# Patient Record
Sex: Female | Born: 1960 | Race: White | Hispanic: No | Marital: Married | Smoking: Never smoker
Health system: Southern US, Community
[De-identification: ages and names within clinical notes are randomized; demographics above are authoritative.]

## PROBLEM LIST (undated history)

## (undated) HISTORY — PX: SHOULDER SURGERY: SHX246

## (undated) HISTORY — PX: HIP SURGERY: SHX245

---

## 2013-02-08 ENCOUNTER — Emergency Department (INDEPENDENT_AMBULATORY_CARE_PROVIDER_SITE_OTHER): Payer: 59

## 2013-02-08 ENCOUNTER — Encounter (HOSPITAL_COMMUNITY): Payer: Self-pay | Admitting: Emergency Medicine

## 2013-02-08 ENCOUNTER — Emergency Department (HOSPITAL_COMMUNITY)
Admission: EM | Admit: 2013-02-08 | Discharge: 2013-02-08 | Disposition: A | Discharge status: Home / Self Care | Payer: 59 | Source: Home / Self Care | Admitting: Family Medicine

## 2013-02-08 DIAGNOSIS — S62639B Displaced fracture of distal phalanx of unspecified finger, initial encounter for open fracture: Secondary | ICD-10-CM

## 2013-02-08 MED ORDER — TETANUS-DIPHTH-ACELL PERTUSSIS 5-2.5-18.5 LF-MCG/0.5 IM SUSP
0.5000 mL | Freq: Once | INTRAMUSCULAR | Status: AC
  Administered 2013-02-08: 0.5 mL via INTRAMUSCULAR

## 2013-02-08 MED ORDER — TETANUS-DIPHTH-ACELL PERTUSSIS 5-2.5-18.5 LF-MCG/0.5 IM SUSP
INTRAMUSCULAR | Status: AC
  Filled 2013-02-08: qty 0.5

## 2013-02-08 NOTE — ED Provider Notes (Signed)
Medical screening examination/treatment/procedure(s) were performed by resident physician or non-physician practitioner and as supervising physician I was immediately available for consultation/collaboration.   Hulet Ehrmann DOUGLAS MD.   Ahmir Bracken D Casondra Gasca, MD 02/08/13 1742 

## 2013-02-08 NOTE — ED Provider Notes (Signed)
   History    CSN: [id number] Arrival date & time 02/08/13  1143  First MD Initiated Contact with Patient 02/08/13 1345     Chief Complaint  Patient presents with  . Finger Injury   (Consider location/radiation/quality/duration/timing/severity/associated sxs/prior Treatment) HPI  52 yo wf presents with the above complaint.  States that yesterday she jammed her right thumb in a car door.  Had had increased pain, swelling.  No numbness.  No other injuries.   History reviewed. No pertinent past medical history. Past Surgical History  Procedure Laterality Date  . Hip surgery    . Shoulder surgery Left    No family history on file. History  Substance Use Topics  . Smoking status: Never Smoker   . Smokeless tobacco: Not on file  . Alcohol Use: Yes   OB History   Grav Para Term Preterm Abortions TAB SAB Ect Mult Living                 Review of Systems  Constitutional: Negative.   HENT: Negative.   Respiratory: Negative.   Cardiovascular: Negative.   Gastrointestinal: Negative.   Genitourinary: Negative.   Musculoskeletal:       Thumb pain.    Skin: Negative.   Neurological: Negative.     Allergies  Review of patient's allergies indicates no known allergies.  Home Medications  No current outpatient prescriptions on file. BP 121/63  Pulse 60  Temp(Src) 98 F (36.7 C) (Oral)  Resp 18  SpO2 100%  LMP 02/07/2013 Physical Exam  Constitutional: She is oriented to person, place, and time. She appears well-developed and well-nourished.  HENT:  Head: Normocephalic and atraumatic.  Eyes: EOM are normal. Pupils are equal, round, and reactive to light.  Neck: Normal range of motion.  Pulmonary/Chest: Effort normal.  Musculoskeletal:  Right distal thumb swelling and tenderness.  Subungual hematoma.  Sensation intact.  Small superficial laceration radial distal thumb.  Bleeding controlled.   Neurological: She is alert and oriented to person, place, and time.  Skin: Skin is  warm and dry.  Psychiatric: She has a normal mood and affect.    ED Course  Procedures (including critical care time) Labs Reviewed - No data to display Dg Finger Thumb Right  02/08/2013   *RADIOLOGY REPORT*  Clinical Data: Recent traumatic injury with pain.  RIGHT THUMB 2+V  Comparison: None.  Findings: There is an undisplaced fracture through the mid portion of the first distal phalanx.  Mild soft tissue swelling is noted. Small bony densities are noted adjacent to the distal interphalangeal joint.  This may be related to prior trauma but not related to the current episode.  IMPRESSION: Undisplaced fracture of the first distal phalanx.   Original Report Authenticated By: Alcide Clever, M.D.   1. Phalanx, distal fracture of finger, open, initial encounter     MDM  Made appt for patient to see dr Cindee Salt today.  Dressing applied.  Given tetanus injection.   Meds ordered this encounter  Medications  . TDaP (BOOSTRIX) injection 0.5 mL    Sig:     Zonia Kief, PA-C 02/08/13 1350

## 2013-02-08 NOTE — ED Notes (Signed)
Pt c/o right thumb inj onset yest around 1500... Reports she jammed her finger w/car door... Laceration on right thumb; painful... Last tetanus = unknown... Alert w/no signs of acuate distress.

## 2013-02-21 ENCOUNTER — Other Ambulatory Visit (HOSPITAL_COMMUNITY): Payer: Self-pay | Admitting: *Deleted

## 2013-02-21 DIAGNOSIS — T8450XA Infection and inflammatory reaction due to unspecified internal joint prosthesis, initial encounter: Secondary | ICD-10-CM

## 2013-02-21 DIAGNOSIS — T84498A Other mechanical complication of other internal orthopedic devices, implants and grafts, initial encounter: Secondary | ICD-10-CM

## 2013-02-21 DIAGNOSIS — M1611 Unilateral primary osteoarthritis, right hip: Secondary | ICD-10-CM

## 2013-03-12 ENCOUNTER — Other Ambulatory Visit (HOSPITAL_COMMUNITY): Payer: 59

## 2013-03-12 ENCOUNTER — Encounter (HOSPITAL_COMMUNITY): Payer: 59

## 2013-03-13 ENCOUNTER — Encounter (HOSPITAL_COMMUNITY): Payer: 59

## 2013-03-20 ENCOUNTER — Encounter (HOSPITAL_COMMUNITY)
Admission: RE | Admit: 2013-03-20 | Discharge: 2013-03-20 | Disposition: A | Discharge status: Home / Self Care | Payer: 59 | Source: Ambulatory Visit | Attending: *Deleted | Admitting: *Deleted

## 2013-03-20 ENCOUNTER — Encounter (HOSPITAL_COMMUNITY)
Admission: RE | Admit: 2013-03-20 | Discharge: 2013-03-20 | Disposition: A | Discharge status: Home / Self Care | Payer: 59 | Source: Ambulatory Visit | Attending: Orthopedic Surgery | Admitting: Orthopedic Surgery

## 2013-03-20 DIAGNOSIS — T8450XA Infection and inflammatory reaction due to unspecified internal joint prosthesis, initial encounter: Secondary | ICD-10-CM

## 2013-03-20 DIAGNOSIS — M1611 Unilateral primary osteoarthritis, right hip: Secondary | ICD-10-CM

## 2013-03-20 DIAGNOSIS — M25559 Pain in unspecified hip: Secondary | ICD-10-CM | POA: Insufficient documentation

## 2013-03-20 DIAGNOSIS — Z96649 Presence of unspecified artificial hip joint: Secondary | ICD-10-CM | POA: Insufficient documentation

## 2013-03-20 DIAGNOSIS — T84498A Other mechanical complication of other internal orthopedic devices, implants and grafts, initial encounter: Secondary | ICD-10-CM

## 2013-03-20 IMAGING — NM NM BONE 3 PHASE
2 series · 12 of 12 positions shown · non-contrast
Comparison: None.

CLINICAL DATA: Right hip pain.  Right total hip arthroplasty with
revisions in  [PHONE_NUMBER]

NUCLEAR MEDICINE THREE PHASE BONE SCAN
TECHNIQUE: Radionuclide angiographic images, immediate static
blood pool images, and 3-hour delayed static images were obtained
after intravenous injection of radiopharmaceutical.
Radiopharmaceutical: [XY] JESU TECHNETIUM TC 99M
MEDRONATE IV KIT

[fl flow and static · 4.75mm/px · 6 of 40 frames shown (1 of 2)]
[frame 4/40  full-range]
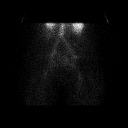
[frame 10/40  full-range]
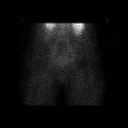
[frame 17/40  full-range]
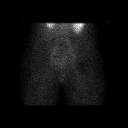
[frame 24/40  full-range]
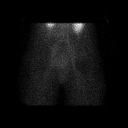
[frame 30/40  full-range]
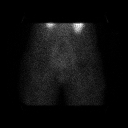
[frame 37/40  full-range]
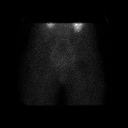

[fl flow and static · 4.75mm/px · 6 of 40 frames shown (2 of 2)]
[frame 4/40  full-range]
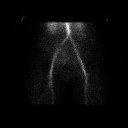
[frame 10/40  full-range]
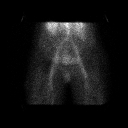
[frame 17/40  full-range]
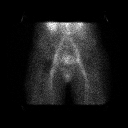
[frame 24/40  full-range]
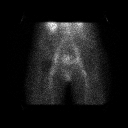
[frame 30/40  full-range]
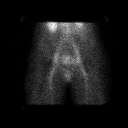
[frame 37/40  full-range]
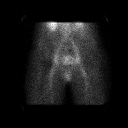

[12 of 12 positions shown; findings below may reference images not displayed]

FINDINGS: Vascular phase:  No asymmetric or increased vascular flow of the
left right hand.

Delayed phase:  No asymmetric or increased blood pool activity

Delayed phase:  There is photopenia the right hip.  No abnormal
uptake to suggest loosening or infection
IMPRESSION: No evidence of loosening or infection of the right hip prosthetic.

## 2013-03-20 MED ORDER — TECHNETIUM TC 99M MEDRONATE IV KIT
26.0000 | PACK | Freq: Once | INTRAVENOUS | Status: AC | PRN
  Administered 2013-03-20: 26 via INTRAVENOUS

## 2013-10-10 ENCOUNTER — Emergency Department (HOSPITAL_COMMUNITY)
Admission: EM | Admit: 2013-10-10 | Discharge: 2013-10-10 | Disposition: A | Discharge status: Home / Self Care | Payer: 59 | Source: Home / Self Care

## 2013-10-10 ENCOUNTER — Encounter (HOSPITAL_COMMUNITY): Payer: Self-pay | Admitting: Emergency Medicine

## 2013-10-10 DIAGNOSIS — S0502XA Injury of conjunctiva and corneal abrasion without foreign body, left eye, initial encounter: Secondary | ICD-10-CM

## 2013-10-10 DIAGNOSIS — H1033 Unspecified acute conjunctivitis, bilateral: Secondary | ICD-10-CM

## 2013-10-10 DIAGNOSIS — S058X9A Other injuries of unspecified eye and orbit, initial encounter: Secondary | ICD-10-CM

## 2013-10-10 MED ORDER — POLYMYXIN B-TRIMETHOPRIM 10000-0.1 UNIT/ML-% OP SOLN
1.0000 [drp] | OPHTHALMIC | Status: DC
Start: 2013-10-10 — End: 2015-08-21

## 2013-10-10 MED ORDER — TETRACAINE HCL 0.5 % OP SOLN
OPHTHALMIC | Status: AC
Start: 2013-10-10 — End: 2013-10-10
  Filled 2013-10-10: qty 2

## 2013-10-10 MED ORDER — TETRACAINE HCL 0.5 % OP SOLN
2.0000 [drp] | Freq: Once | OPHTHALMIC | Status: AC
  Administered 2013-10-10: 2 [drp] via OPHTHALMIC

## 2013-10-10 NOTE — ED Notes (Signed)
Left eye red and draining yesterday, this AM both eye red and left eye crusted. C/o FB sensation in left eye. No known FB type exposure known

## 2013-10-10 NOTE — ED Provider Notes (Signed)
CSN: [id number]     Arrival date & time 10/10/13  0904 History   First MD Initiated Contact with Patient 10/10/13 0930     Chief Complaint  Patient presents with  . Conjunctivitis   (Consider location/radiation/quality/duration/timing/severity/associated sxs/prior Treatment) HPI Comments: Early yesterday had redness and clear drainage from OS, f/b a FB sensation.  Today developing redness and irritation in OD. Denies visual changes, trauma.   History reviewed. No pertinent past medical history. Past Surgical History  Procedure Laterality Date  . Hip surgery    . Shoulder surgery Left    History reviewed. No pertinent family history. History  Substance Use Topics  . Smoking status: Never Smoker   . Smokeless tobacco: Not on file  . Alcohol Use: Yes   OB History   Grav Para Term Preterm Abortions TAB SAB Ect Mult Living                 Review of Systems  Eyes: Positive for discharge and redness. Negative for visual disturbance.  Skin: Negative for rash.  Psychiatric/Behavioral: Negative.   All other systems reviewed and are negative.    Allergies  Review of patient's allergies indicates no known allergies.  Home Medications   Current Outpatient Rx  Name  Route  Sig  Dispense  Refill  . trimethoprim-polymyxin b (POLYTRIM) ophthalmic solution   Both Eyes   Place 1 drop into both eyes every 4 (four) hours.   10 mL   0    BP 110/71  Pulse 59  Temp(Src) 98.2 F (36.8 C) (Oral)  Resp 10  SpO2 100% Physical Exam  Nursing note and vitals reviewed. Constitutional: She is oriented to person, place, and time. She appears well-developed and well-nourished. No distress.  Eyes: EOM are normal. Pupils are equal, round, and reactive to light.  Bilat conjunctiva mildly injected L>R. Clear to amber drainage from OS. No FB seen with magnification.   Pulmonary/Chest: Effort normal. She has no wheezes.  Neurological: She is alert and oriented to person, place, and time.   Skin: Skin is warm and dry. No rash noted.  Psychiatric: She has a normal mood and affect.    ED Course  Irrigation Date/Time: 10/10/2013 10:02 AM Performed by: Phineas RealMABE, Deb Loudin Authorized by: Clementeen GrahamOREY, EVAN, S Consent: Verbal consent obtained. Risks and benefits: risks, benefits and alternatives were discussed Consent given by: patient Patient understanding: patient states understanding of the procedure being performed Patient identity confirmed: verbally with patient Local anesthesia used: yes Local anesthetic: topical anesthetic Anesthetic total: 0.02 ml Patient sedated: no Patient tolerance: Patient tolerated the procedure well with no immediate complications. Comments: Tetracaine op gtts x 2 OS. Flurosceine stain applied. Under blue light uptake is seen at 3 and 4 o'clock position over the outer cornea. There are 3 separate irregularly shaped lesions adjacent to one another. The OS was then irrigated.   (including critical care time) Labs Review Labs Reviewed - No data to display Imaging Review No results found.   MDM   1. Left corneal abrasion   2. Conjunctivitis, acute, bilateral    OS irrigated after staining Polytrim op qid ou x 5  D Warm compresses. If worse or no improvement may return or see opthal    Hayden Rasmussenavid Chen Holzman, NP 10/10/13 1005

## 2013-10-10 NOTE — Discharge Instructions (Signed)
Bacterial Conjunctivitis °Bacterial conjunctivitis, commonly called pink eye, is an inflammation of the clear membrane that covers the white part of the eye (conjunctiva). The inflammation can also happen on the underside of the eyelids. The blood vessels in the conjunctiva become inflamed causing the eye to become red or pink. Bacterial conjunctivitis may spread easily from one eye to another and from person to person (contagious).  °CAUSES  °Bacterial conjunctivitis is caused by bacteria. The bacteria may come from your own skin, your upper respiratory tract, or from someone else with bacterial conjunctivitis. °SYMPTOMS  °The normally white color of the eye or the underside of the eyelid is usually pink or red. The pink eye is usually associated with irritation, tearing, and some sensitivity to light. Bacterial conjunctivitis is often associated with a thick, yellowish discharge from the eye. The discharge may turn into a crust on the eyelids overnight, which causes your eyelids to stick together. If a discharge is present, there may also be some blurred vision in the affected eye. °DIAGNOSIS  °Bacterial conjunctivitis is diagnosed by your caregiver through an eye exam and the symptoms that you report. Your caregiver looks for changes in the surface tissues of your eyes, which may point to the specific type of conjunctivitis. A sample of any discharge may be collected on a cotton-tip swab if you have a severe case of conjunctivitis, if your cornea is affected, or if you keep getting repeat infections that do not respond to treatment. The sample will be sent to a lab to see if the inflammation is caused by a bacterial infection and to see if the infection will respond to antibiotic medicines. °TREATMENT  °· Bacterial conjunctivitis is treated with antibiotics. Antibiotic eyedrops are most often used. However, antibiotic ointments are also available. Antibiotics pills are sometimes used. Artificial tears or eye  washes may ease discomfort. °HOME CARE INSTRUCTIONS  °· To ease discomfort, apply a cool, clean wash cloth to your eye for 10 20 minutes, 3 4 times a day. °· Gently wipe away any drainage from your eye with a warm, wet washcloth or a cotton ball. °· Wash your hands often with soap and water. Use paper towels to dry your hands. °· Do not share towels or wash cloths. This may spread the infection. °· Change or wash your pillow case every day. °· You should not use eye makeup until the infection is gone. °· Do not operate machinery or drive if your vision is blurred. °· Stop using contacts lenses. Ask your caregiver how to sterilize or replace your contacts before using them again. This depends on the type of contact lenses that you use. °· When applying medicine to the infected eye, do not touch the edge of your eyelid with the eyedrop bottle or ointment tube. °SEEK IMMEDIATE MEDICAL CARE IF:  °· Your infection has not improved within 3 days after beginning treatment. °· You had yellow discharge from your eye and it returns. °· You have increased eye pain. °· Your eye redness is spreading. °· Your vision becomes blurred. °· You have a fever or persistent symptoms for more than 2 3 days. °· You have a fever and your symptoms suddenly get worse. °· You have facial pain, redness, or swelling. °MAKE SURE YOU:  °· Understand these instructions. °· Will watch your condition. °· Will get help right away if you are not doing well or get worse. °Document Released: 07/05/2005 Document Revised: 03/29/2012 Document Reviewed: 12/06/2011 °[employer]® Patient Information ©2014 [employer]. ° ° °  Conjunctivitis Conjunctivitis is commonly called "pink eye." Conjunctivitis can be caused by bacterial or viral infection, allergies, or injuries. There is usually redness of the lining of the eye, itching, discomfort, and sometimes discharge. There may be deposits of matter along the eyelids. A viral infection usually causes a watery  discharge, while a bacterial infection causes a yellowish, thick discharge. Pink eye is very contagious and spreads by direct contact. You may be given antibiotic eyedrops as part of your treatment. Before using your eye medicine, remove all drainage from the eye by washing gently with warm water and cotton balls. Continue to use the medication until you have awakened 2 mornings in a row without discharge from the eye. Do not rub your eye. This increases the irritation and helps spread infection. Use separate towels from other household members. Wash your hands with soap and water before and after touching your eyes. Use cold compresses to reduce pain and sunglasses to relieve irritation from light. Do not wear contact lenses or wear eye makeup until the infection is gone. SEEK MEDICAL CARE IF:   Your symptoms are not better after 3 days of treatment.  You have increased pain or trouble seeing.  The outer eyelids become very red or swollen. Document Released: 08/12/2004 Document Revised: 09/27/2011 Document Reviewed: 07/05/2005 [facility] Patient Information 2014 [place], [place].  Corneal Abrasion The cornea is the clear covering at the front and center of the eye. When looking at the colored portion of the eye (iris), you are looking through the cornea. This very thin tissue is made up of many layers. The surface layer is a single layer of cells (corneal epithelium) and is one of the most sensitive tissues in the body. If a scratch or injury causes the corneal epithelium to come off, it is called a corneal abrasion. If the injury extends to the tissues below the epithelium, the condition is called a corneal ulcer. CAUSES   Scratches.  Trauma.  Foreign body in the eye. Some people have recurrences of abrasions in the area of the original injury even after it has healed (recurrent erosion syndrome). Recurrent erosion syndrome generally improves and goes away with time. SYMPTOMS   Eye  pain.  Difficulty or inability to keep the injured eye open.  The eye becomes very sensitive to light.  Recurrent erosions tend to happen suddenly, first thing in the morning, usually after waking up and opening the eye. DIAGNOSIS  Your health care provider can diagnose a corneal abrasion during an eye exam. Dye is usually placed in the eye using a drop or a small paper strip moistened by your tears. When the eye is examined with a special light, the abrasion shows up clearly because of the dye. TREATMENT   Small abrasions may be treated with antibiotic drops or ointment alone.  Usually a pressure patch is specially applied. Pressure patches prevent the eye from blinking, allowing the corneal epithelium to heal. A pressure patch also reduces the amount of pain present in the eye during healing. Most corneal abrasions heal within 2 3 days with no effect on vision. If the abrasion becomes infected and spreads to the deeper tissues of the cornea, a corneal ulcer can result. This is serious because it can cause corneal scarring. Corneal scars interfere with light passing through the cornea and cause a loss of vision in the involved eye. HOME CARE INSTRUCTIONS  Use medicine or ointment as directed. Only take over-the-counter or prescription medicines for pain, discomfort, or fever as  directed by your health care provider.  Do not drive or operate machinery while your eye is patched. Your ability to judge distances is impaired.  If your health care provider has given you a follow-up appointment, it is very important to keep that appointment. Not keeping the appointment could result in a severe eye infection or permanent loss of vision. If there is any problem keeping the appointment, let your health care provider know. SEEK MEDICAL CARE IF:   You have pain, light sensitivity, and a scratchy feeling in one eye or both eyes.  Your pressure patch keeps loosening up, and you can blink your eye under  the patch after treatment.  Any kind of discharge develops from the eye after treatment or if the lids stick together in the morning.  You have the same symptoms in the morning as you did with the original abrasion days, weeks, or months after the abrasion healed. MAKE SURE YOU:   Understand these instructions.  Will watch your condition.  Will get help right away if you are not doing well or get worse. Document Released: 07/02/2000 Document Revised: 04/25/2013 Document Reviewed: 03/12/2013 [facility] Patient Information 2014 [place], [place].

## 2013-10-11 NOTE — ED Provider Notes (Signed)
Medical screening examination/treatment/procedure(s) were performed by a resident physician or non-physician practitioner and as the supervising physician I was immediately available for consultation/collaboration.  Evan Corey, MD    Evan S Corey, MD 10/11/13 1816 

## 2015-08-21 ENCOUNTER — Emergency Department (INDEPENDENT_AMBULATORY_CARE_PROVIDER_SITE_OTHER)
Admission: EM | Admit: 2015-08-21 | Discharge: 2015-08-21 | Disposition: A | Discharge status: Home / Self Care | Payer: 59 | Source: Home / Self Care | Attending: Family Medicine | Admitting: Family Medicine

## 2015-08-21 ENCOUNTER — Encounter: Payer: Self-pay | Admitting: *Deleted

## 2015-08-21 DIAGNOSIS — H109 Unspecified conjunctivitis: Secondary | ICD-10-CM

## 2015-08-21 MED ORDER — POLYMYXIN B-TRIMETHOPRIM 10000-0.1 UNIT/ML-% OP SOLN: 1.0000 [drp] | OPHTHALMIC | Status: DC

## 2015-08-21 NOTE — ED Provider Notes (Signed)
CSN: [id number]     Arrival date & time 08/21/15  4098 History   First MD Initiated Contact with Patient 08/21/15 0935     Chief Complaint  Patient presents with  . Eye Drainage   (Consider location/radiation/quality/duration/timing/severity/associated sxs/prior Treatment) HPI Pt is a 56yo female presenting to [facility] with c/o Right eye redness, itching, and mild burning with clear-yellow drainage since yesterday. Denies fever, chills, n/v/d. Denies cough, congestion, sore throat, or ear pain.  She had hx of bacterial conjunctivitis about 2 years ago with a corneal abrasion and had leftover Polytrim, which she started using yesterday. She has had mild to moderate relief of symptoms since using the eye drops. Denies sick contacts or recent travel. Denies trauma to her eye. Does not believe there is something in her eye but feels a foreign body sensation at times. Denies wearing contacts but does wear reading glasses.  History reviewed. No pertinent past medical history. Past Surgical History  Procedure Laterality Date  . Hip surgery    . Shoulder surgery Left    Family History  Problem Relation Age of Onset  . Diabetes Father   . Diabetes Brother    Social History  Substance Use Topics  . Smoking status: Never Smoker   . Smokeless tobacco: Never Used  . Alcohol Use: Yes   OB History    No data available     Review of Systems  Constitutional: Negative for fever and chills.  HENT: Negative for congestion, ear pain, postnasal drip, rhinorrhea, sinus pressure, sore throat and voice change.   Eyes: Positive for pain, discharge, redness and itching. Negative for photophobia and visual disturbance.  Respiratory: Negative for cough and shortness of breath.   Gastrointestinal: Negative for nausea, vomiting, abdominal pain and diarrhea.  Neurological: Negative for dizziness, light-headedness and headaches.    Allergies  Review of patient's allergies indicates no known allergies.  Home  Medications   Prior to Admission medications   Medication Sig Start Date End Date Taking? Authorizing Provider  trimethoprim-polymyxin b (POLYTRIM) ophthalmic solution Place 1 drop into the right eye every 4 (four) hours. For 5 days 08/21/15   Junius Finner, PA-C   Meds Ordered and Administered this Visit  Medications - No data to display  BP 118/64 mmHg  Pulse 60  Temp(Src) 97.5 F (36.4 C) (Oral)  Resp 14  Ht  (1.676 m)  Wt 131 lb (59.421 kg)  BMI 21.15 kg/m2  SpO2 98% No data found.   Physical Exam  Constitutional: She is oriented to person, place, and time. She appears well-developed and well-nourished.  HENT:  Head: Normocephalic and atraumatic.  Eyes: EOM are normal. Pupils are equal, round, and reactive to light. Lids are everted and swept, no foreign bodies found. Right eye exhibits discharge ( scant yellow discharge). Right eye exhibits no chemosis and no exudate. Left eye exhibits no chemosis, no discharge and no exudate. Right conjunctiva is injected. Right conjunctiva has no hemorrhage. Left conjunctiva is not injected. Left conjunctiva has no hemorrhage.  Right eye: mild injection. No periorbital edema, erythema or tenderness. Scant yellow discharge.  No fluorescein uptake. No foreign bodies seen.  Neck: Normal range of motion.  Cardiovascular: Normal rate.   Pulmonary/Chest: Effort normal.  Musculoskeletal: Normal range of motion.  Neurological: She is alert and oriented to person, place, and time.  Skin: Skin is warm and dry.  Psychiatric: She has a normal mood and affect. Her behavior is normal.  Nursing note and vitals reviewed.  ED Course  Procedures (including critical care time)  Labs Review Labs Reviewed - No data to display  Imaging Review No results found.   Visual Acuity Review  Right Eye Distance: 20/25 Left Eye Distance: 20/25 Bilateral Distance: 20/25   MDM   1. Conjunctivitis, right eye    Pt c/o Right eye irritation, redness,  and discharge since yesterday. No evidence of periorbital cellulitis.   No evidence of foreign bodies or corneal abrasion.  Pt has already started using Polytrim and has had improvement since yesterday per pt.   Will refill fresh prescription as medication she is using is 57 years old. Advised to use for a total of 5 days.  Rx: Polytrim Encouraged to f/u with PCP or ophthalmologist/optomotrist in 4-5 days if not improving, sooner if worsening. Patient verbalized understanding and agreement with treatment plan.     Junius Finner, PA-C 08/21/15 1215

## 2015-08-21 NOTE — Discharge Instructions (Signed)
Bacterial Conjunctivitis °Bacterial conjunctivitis, commonly called pink eye, is an inflammation of the clear membrane that covers the white part of the eye (conjunctiva). The inflammation can also happen on the underside of the eyelids. The blood vessels in the conjunctiva become inflamed, causing the eye to become red or pink. Bacterial conjunctivitis may spread easily from one eye to another and from person to person (contagious).  °CAUSES  °Bacterial conjunctivitis is caused by bacteria. The bacteria may come from your own skin, your upper respiratory tract, or from someone else with bacterial conjunctivitis. °SYMPTOMS  °The normally white color of the eye or the underside of the eyelid is usually pink or red. The pink eye is usually associated with irritation, tearing, and some sensitivity to light. Bacterial conjunctivitis is often associated with a thick, yellowish discharge from the eye. The discharge may turn into a crust on the eyelids overnight, which causes your eyelids to stick together. If a discharge is present, there may also be some blurred vision in the affected eye. °DIAGNOSIS  °Bacterial conjunctivitis is diagnosed by your caregiver through an eye exam and the symptoms that you report. Your caregiver looks for changes in the surface tissues of your eyes, which may point to the specific type of conjunctivitis. A sample of any discharge may be collected on a cotton-tip swab if you have a severe case of conjunctivitis, if your cornea is affected, or if you keep getting repeat infections that do not respond to treatment. The sample will be sent to a lab to see if the inflammation is caused by a bacterial infection and to see if the infection will respond to antibiotic medicines. °TREATMENT  °1. Bacterial conjunctivitis is treated with antibiotics. Antibiotic eyedrops are most often used. However, antibiotic ointments are also available. Antibiotics pills are sometimes used. Artificial tears or eye  washes may ease discomfort. °HOME CARE INSTRUCTIONS  °1. To ease discomfort, apply a cool, clean washcloth to your eye for 10-20 minutes, 3-4 times a day. °2. Gently wipe away any drainage from your eye with a warm, wet washcloth or a cotton ball. °3. Wash your hands often with soap and water. Use paper towels to dry your hands. °4. Do not share towels or washcloths. This may spread the infection. °5. Change or wash your pillowcase every day. °6. You should not use eye makeup until the infection is gone. °7. Do not operate machinery or drive if your vision is blurred. °8. Stop using contact lenses. Ask your caregiver how to sterilize or replace your contacts before using them again. This depends on the type of contact lenses that you use. °9. When applying medicine to the infected eye, do not touch the edge of your eyelid with the eyedrop bottle or ointment tube. °SEEK IMMEDIATE MEDICAL CARE IF:  °· Your infection has not improved within 3 days after beginning treatment. °· You had yellow discharge from your eye and it returns. °· You have increased eye pain. °· Your eye redness is spreading. °· Your vision becomes blurred. °· You have a fever or persistent symptoms for more than 2-3 days. °· You have a fever and your symptoms suddenly get worse. °· You have facial pain, redness, or swelling. °MAKE SURE YOU:  °· Understand these instructions. °· Will watch your condition. °· Will get help right away if you are not doing well or get worse. °  °This information is not intended to replace advice given to you by your health care provider. Make sure you   discuss any questions you have with your health care provider. °  °Document Released: 07/05/2005 Document Revised: 07/26/2014 Document Reviewed: 12/06/2011 °Elsevier Interactive Patient Education ©2016 [employer] ° °How to Use Eye Drops and Eye Ointments °HOW TO APPLY EYE DROPS °Follow these steps when applying eye drops: °2. Wash your hands. °3. Tilt your head  back. °4. Put a finger under your eye and use it to gently pull your lower lid downward. Keep that finger in place. °5. Using your other hand, hold the dropper between your thumb and index finger. °6. Position the dropper just over the edge of the lower lid. Hold it as close to your eye as you can without touching the dropper to your eye. °7. Steady your hand. One way to do this is to lean your index finger against your brow. °8. Look up. °9. Slowly and gently squeeze one drop of medicine into your eye. °10. Close your eye. °11. Place a finger between your lower eyelid and your nose. Press gently for 2 minutes. This increases the amount of time that the medicine is exposed to the eye. It also reduces side effects that can develop if the drop gets into the bloodstream through the nose. °HOW TO APPLY EYE OINTMENTS °Follow these steps when applying eye ointments: °10. Wash your hands. °11. Put a finger under your eye and use it to gently pull your lower lid downward. Keep that finger in place. °12. Using your other hand, place the tip of the tube between your thumb and index finger with the remaining fingers braced against your cheek or nose. °13. Hold the tube just over the edge of your lower lid without touching the tube to your lid or eyeball. °14. Look up. °15. Line the inner part of your lower lid with ointment. °16. Gently pull up on your upper lid and look down. This will force the ointment to spread over the surface of the eye. °17. Release the upper lid. °18. If you can, close your eyes for 1-2 minutes. °Do not rub your eyes. If you applied the ointment correctly, your vision will be blurry for a few minutes. This is normal. °ADDITIONAL INFORMATION °· Make sure to use the eye drops or ointment as told by your health care provider. °· If you have been told to use both eye drops and an eye ointment, apply the eye drops first, then wait 3-4 minutes before you apply the ointment. °· Try not to touch the tip of the  dropper or tube to your eye. A dropper or tube that has touched the eye can become contaminated. °  °This information is not intended to replace advice given to you by your health care provider. Make sure you discuss any questions you have with your health care provider. °  °Document Released: 10/11/2000 Document Revised: 11/19/2014 Document Reviewed: 07/01/2014 °Elsevier Interactive Patient Education ©2016 [employer] ° °

## 2015-08-21 NOTE — ED Notes (Signed)
Pt c/o right eye redness and drainage since yesterday. Used old rx of polymyxin gtts.

## 2015-08-23 ENCOUNTER — Telehealth: Payer: Self-pay | Admitting: Emergency Medicine

## 2017-04-07 ENCOUNTER — Ambulatory Visit: Payer: Self-pay | Admitting: Podiatry

## 2017-04-14 ENCOUNTER — Ambulatory Visit: Payer: Self-pay | Admitting: Podiatry

## 2017-04-27 ENCOUNTER — Other Ambulatory Visit: Payer: Self-pay | Admitting: Podiatry

## 2017-04-27 ENCOUNTER — Encounter: Payer: Self-pay | Admitting: Podiatry

## 2017-04-27 ENCOUNTER — Ambulatory Visit (INDEPENDENT_AMBULATORY_CARE_PROVIDER_SITE_OTHER): Payer: 59

## 2017-04-27 ENCOUNTER — Ambulatory Visit (INDEPENDENT_AMBULATORY_CARE_PROVIDER_SITE_OTHER): Payer: 59 | Admitting: Podiatry

## 2017-04-27 VITALS — BP 101/56 | HR 60 | Resp 16

## 2017-04-27 DIAGNOSIS — M779 Enthesopathy, unspecified: Secondary | ICD-10-CM | POA: Diagnosis not present

## 2017-04-27 DIAGNOSIS — M79672 Pain in left foot: Secondary | ICD-10-CM | POA: Diagnosis not present

## 2017-04-27 DIAGNOSIS — M79671 Pain in right foot: Secondary | ICD-10-CM

## 2017-04-27 NOTE — Progress Notes (Signed)
Subjective:    Patient ID: Alexandra Mccarthy, female   DOB: 56 y.o.   MRN: [id number]   HPI patient presents stating she's getting moderate discomfort underneath both her feet and that it's been ongoing and gradually becoming more aggravating for her. Patient states that she did have bunion surgery done a number of years ago in [place] and she does not smoke    Review of Systems  All other systems reviewed and are negative.       Objective:  Physical Exam  Constitutional: She appears well-developed and well-nourished.  Cardiovascular: Intact distal pulses.   Pulmonary/Chest: Effort normal.  Musculoskeletal: Normal range of motion.  Neurological: She is alert.  Skin: Skin is warm.  Nursing note and vitals reviewed.  neurovascular status found to be intact muscle strength adequate range of motion within normal limits with patient noted to have moderate discomfort in the lesser MPJs bilateral second third and fourth with no specific area pain and mild swelling. Has had bunion surgery left and has moderate structural bunion deformity right and was found to have good digital perfusion well oriented 3     Assessment:    Inflammatory capsulitis of a moderate nature with metatarsalgia with mechanical dysfunction and structural bunion deformity right     Plan:   H&P condition reviewed and at this time I went ahead and I discussed long-term orthotic therapy. I've recommended a softer type device first patella shoes and walking shoes and then we can consider another device for different types shoes to offload weight off the metatarsals. I do think she will require metatarsal padding and a thicker type device and she is referred to [facility] for evaluation and casting  X-rays indicate screws in place left first metatarsal with mild diminishment of arch height bilateral

## 2017-05-04 ENCOUNTER — Telehealth: Payer: Self-pay | Admitting: Podiatry

## 2017-05-04 NOTE — Telephone Encounter (Signed)
lvm for pt to call regarding orthotic coverage. Pt has a 20% coinsurance she would be responsible for. She is scheduled 10.22.18 to see Raiford NobleRick.

## 2017-05-04 NOTE — Telephone Encounter (Signed)
Pt left message on my vm at 350pm confirming to go ahead with orthotics and she will be here for her scheduled appt.

## 2017-05-09 ENCOUNTER — Ambulatory Visit (INDEPENDENT_AMBULATORY_CARE_PROVIDER_SITE_OTHER): Payer: 59 | Admitting: Orthotics

## 2017-05-09 DIAGNOSIS — M779 Enthesopathy, unspecified: Secondary | ICD-10-CM

## 2017-05-09 DIAGNOSIS — M79672 Pain in left foot: Secondary | ICD-10-CM

## 2017-05-09 DIAGNOSIS — M79671 Pain in right foot: Secondary | ICD-10-CM

## 2017-05-09 NOTE — Progress Notes (Signed)
Patient came into today to be cast for Custom Foot Orthotics. Upon recommendation of Dr. Regal Patient presentCharlsie Mccarthy with pes cavus foot type, metatarsalgia Goals are arch support and FF padding (dancers) Plan vendor [employer]

## 2017-05-30 ENCOUNTER — Ambulatory Visit: Payer: 59 | Admitting: Orthotics

## 2017-05-30 DIAGNOSIS — M7750 Other enthesopathy of unspecified foot: Secondary | ICD-10-CM | POA: Diagnosis not present

## 2017-05-30 DIAGNOSIS — M779 Enthesopathy, unspecified: Secondary | ICD-10-CM

## 2017-05-30 NOTE — Progress Notes (Signed)
Patient came in today to pick up custom made foot orthotics.  The goals were accomplished and the patient reported no dissatisfaction with said orthotics.  Patient was advised of breakin period and how to report any issues. 

## 2017-05-31 ENCOUNTER — Other Ambulatory Visit: Payer: 59 | Admitting: Orthotics

## 2017-07-25 ENCOUNTER — Telehealth: Payer: Self-pay | Admitting: Podiatry

## 2017-07-25 NOTE — Telephone Encounter (Signed)
Pt left voicemail asking if she could order a second pair of orthotics.   Called pt back and she would like to order a second pair for the cash price of 199.00. [place] to [facility] the first pair.

## 2017-07-28 ENCOUNTER — Ambulatory Visit (HOSPITAL_COMMUNITY)
Admission: EM | Admit: 2017-07-28 | Discharge: 2017-07-28 | Disposition: A | Discharge status: Home / Self Care | Payer: 59 | Attending: Family Medicine | Admitting: Family Medicine

## 2017-07-28 ENCOUNTER — Encounter (HOSPITAL_COMMUNITY): Payer: Self-pay

## 2017-07-28 ENCOUNTER — Other Ambulatory Visit: Payer: Self-pay

## 2017-07-28 DIAGNOSIS — L03211 Cellulitis of face: Secondary | ICD-10-CM

## 2017-07-28 MED ORDER — CEPHALEXIN 500 MG PO CAPS: 500.0000 mg | ORAL_CAPSULE | Freq: Four times a day (QID) | ORAL | 0 refills | Status: AC

## 2017-07-28 MED ORDER — MUPIROCIN 2 % EX OINT: 1.0000 "application " | TOPICAL_OINTMENT | Freq: Two times a day (BID) | CUTANEOUS | 0 refills | Status: AC

## 2017-07-28 NOTE — ED Provider Notes (Signed)
[facility]    CSN: [id number] Arrival date & time: 07/28/17  1635     History   Chief Complaint Chief Complaint  Patient presents with  . Facial Swelling    nose    HPI Mat CarneJulie Everetts is a 57 y.o. female.   57 year old female comes in for a 1 week history of no swelling and erythema.  States she now has some throbbing pressure on the nose as well.  States she  thought it started out as a pimple, but symptoms has been slowly getting worse.  Denies itchiness.  Swelling with spreading erythema.  Has been applying warm compress without relief.  Denies fever, chills, night sweats.       History reviewed. No pertinent past medical history.  There are no active problems to display for this patient.   Past Surgical History:  Procedure Laterality Date  . HIP SURGERY    . SHOULDER SURGERY Left     OB History    No data available       Home Medications    Prior to Admission medications   Medication Sig Start Date End Date Taking? Authorizing Provider  cephALEXin (KEFLEX) 500 MG capsule Take 1 capsule (500 mg total) by mouth 4 (four) times daily. 07/28/17   Cathie HoopsYu, Kateland Leisinger V, PA-C  mupirocin ointment (BACTROBAN) 2 % Apply 1 application topically 2 (two) times daily. 07/28/17   Belinda FisherYu, Siah Steely V, PA-C    Family History Family History  Problem Relation Age of Onset  . Diabetes Father   . Diabetes Brother     Social History Social History   Tobacco Use  . Smoking status: Never Smoker  . Smokeless tobacco: Never Used  Substance Use Topics  . Alcohol use: Yes  . Drug use: No     Allergies   Patient has no known allergies.   Review of Systems Review of Systems  Reason unable to perform ROS: See HPI as above.     Physical Exam Triage Vital Signs ED Triage Vitals  Enc Vitals Group     BP 07/28/17 1718 (!) 111/58     Pulse Rate 07/28/17 1718 63     Resp 07/28/17 1718 16     Temp 07/28/17 1718 99.1 F (37.3 C)     Temp Source 07/28/17 1718 Oral     SpO2  07/28/17 1718 100 %     Weight --      Height --      Head Circumference --      Peak Flow --      Pain Score 07/28/17 1719 3     Pain Loc --      Pain Edu? --      Excl. in GC? --    No data found.  Updated Vital Signs BP (!) 111/58 (BP Location: Left Arm)   Pulse 63   Temp 99.1 F (37.3 C) (Oral)   Resp 16   SpO2 100%   Physical Exam  Constitutional: She is oriented to person, place, and time. She appears well-developed and well-nourished. No distress.  HENT:  Head: Normocephalic and atraumatic.  See picture below.  Erythema with increased warmth.  Slight tenderness to palpation.  No fluctuance felt.  Eyes: Conjunctivae are normal. Pupils are equal, round, and reactive to light.  Neurological: She is alert and oriented to person, place, and time.         UC Treatments / Results  Labs (all labs ordered are listed, but only  abnormal results are displayed) Labs Reviewed - No data to display  EKG  EKG Interpretation None       Radiology No results found.  Procedures Procedures (including critical care time)  Medications Ordered in UC Medications - No data to display   Initial Impression / Assessment and Plan / UC Course  I have reviewed the triage vital signs and the nursing notes.  Pertinent labs & imaging results that were available during my care of the patient were reviewed by me and considered in my medical decision making (see chart for details).    Given history and exam, will treat for cellulitis with Keflex.  Bactroban ointment provided as well.  Return precautions given.  Patient expresses understanding and agrees to plan.  Final Clinical Impressions(s) / UC Diagnoses   Final diagnoses:  Cellulitis of face    ED Discharge Orders        Ordered    cephALEXin (KEFLEX) 500 MG capsule  4 times daily     07/28/17 1751    mupirocin ointment (BACTROBAN) 2 %  2 times daily     07/28/17 1751        Belinda Fisher, PA-C 07/28/17 1800

## 2017-07-28 NOTE — Discharge Instructions (Signed)
Symptoms most likely due to skin infection, this could be due to cracked skin/bug bite that introduced bacteria to the area. Start keflex as directed. You can apply bactroban ointment as well for the skin infection. Keep area clean and dry. Monitor for spreading redness, increased warmth, fever, follow up for reevaluation.

## 2017-07-28 NOTE — ED Triage Notes (Signed)
[facility], pt complains of throbbing pressure on nose

## 2017-08-26 DIAGNOSIS — M79676 Pain in unspecified toe(s): Secondary | ICD-10-CM
